# Patient Record
Sex: Female | Born: 1990 | Race: White | Hispanic: No | Marital: Single
Health system: Southern US, Community
[De-identification: ages and names within clinical notes are randomized; demographics above are authoritative.]

---

## 2015-06-19 ENCOUNTER — Ambulatory Visit
Admission: RE | Admit: 2015-06-19 | Discharge: 2015-06-19 | Disposition: A | Discharge status: Home / Self Care | Payer: Self-pay | Source: Ambulatory Visit | Attending: Family Medicine | Admitting: Family Medicine

## 2015-06-19 ENCOUNTER — Other Ambulatory Visit: Payer: Self-pay | Admitting: Family Medicine

## 2015-06-19 DIAGNOSIS — R52 Pain, unspecified: Secondary | ICD-10-CM

## 2016-06-29 IMAGING — CR DG ELBOW COMPLETE 3+V*L*
4 series · 4 of 4 positions shown · non-contrast
Comparison: None.

CLINICAL DATA: Fall on outstretched arm 1 month ago, clicking and
popping sensation, pain, initial encounter.

EXAM:
LEFT ELBOW - COMPLETE 3+ VIEW

[view not recorded (1 of 4)]
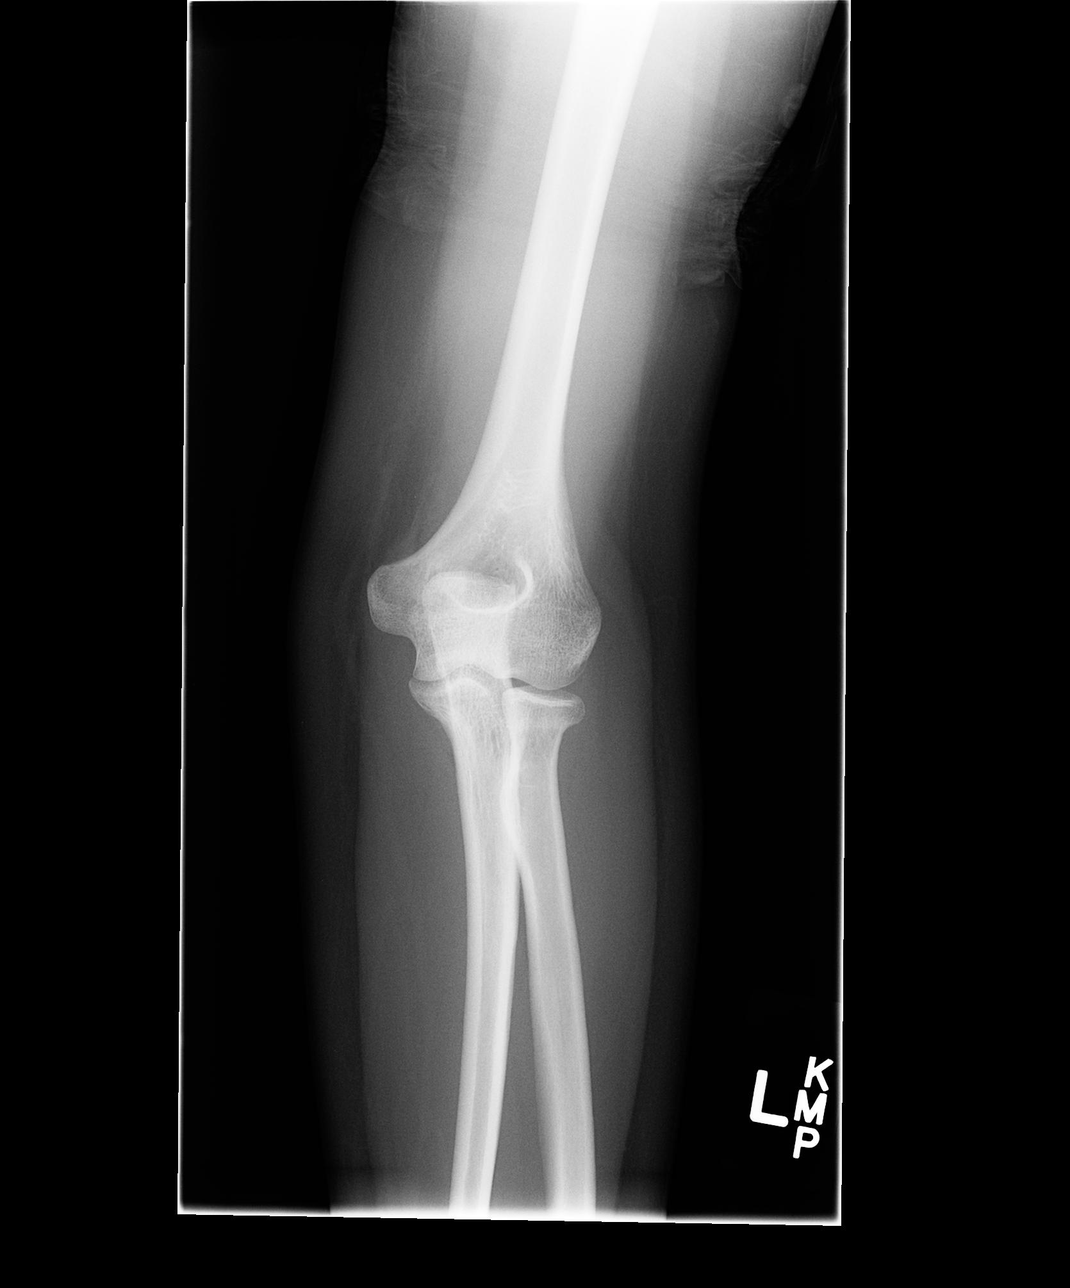

[view not recorded (2 of 4)]
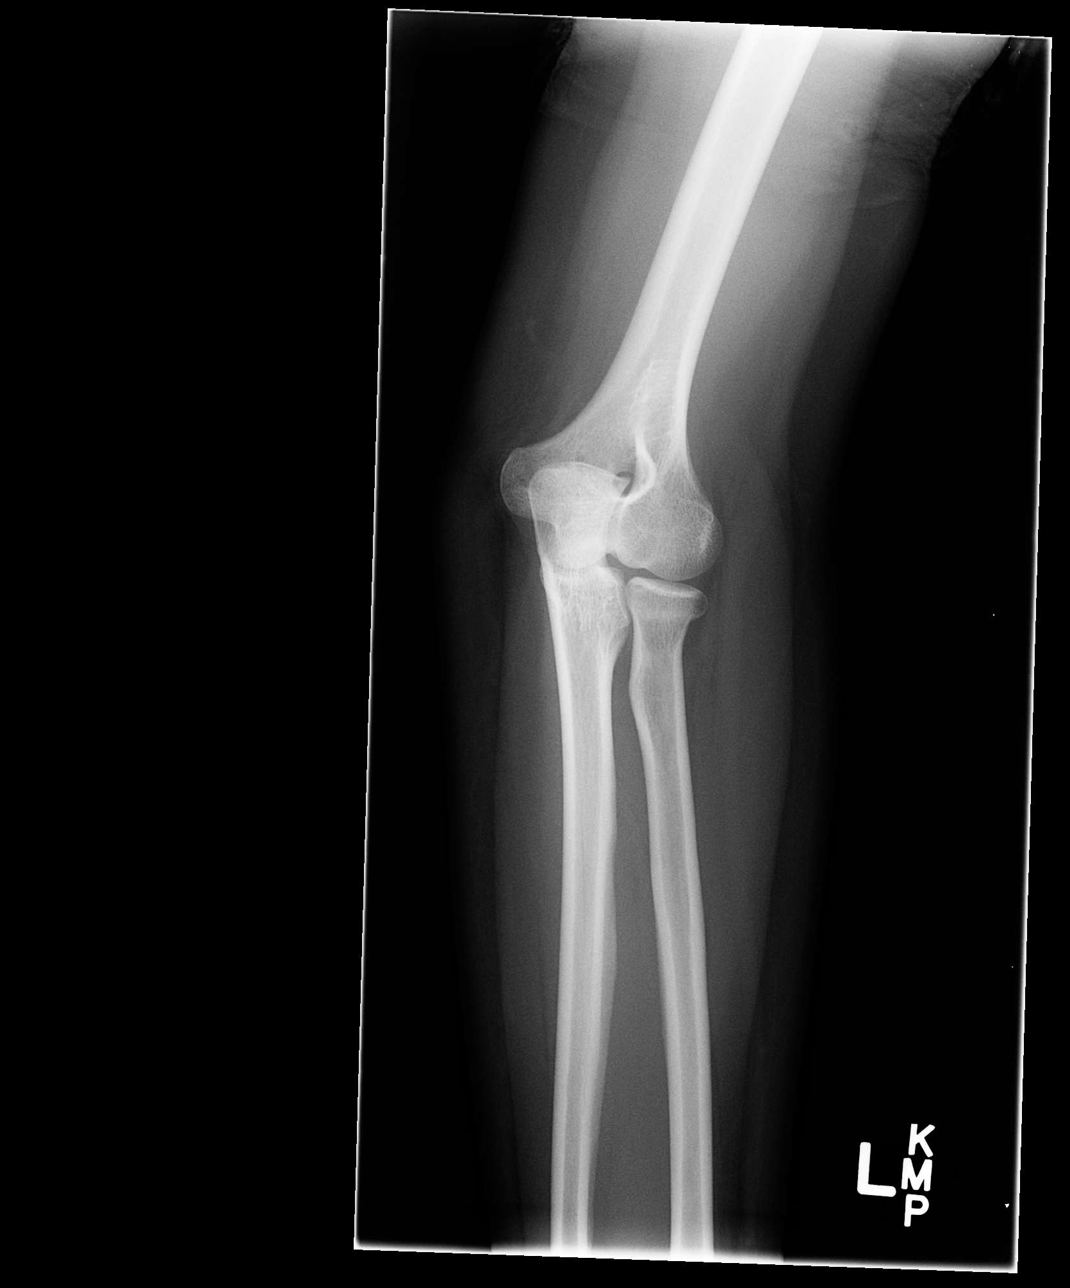

[view not recorded (3 of 4)]
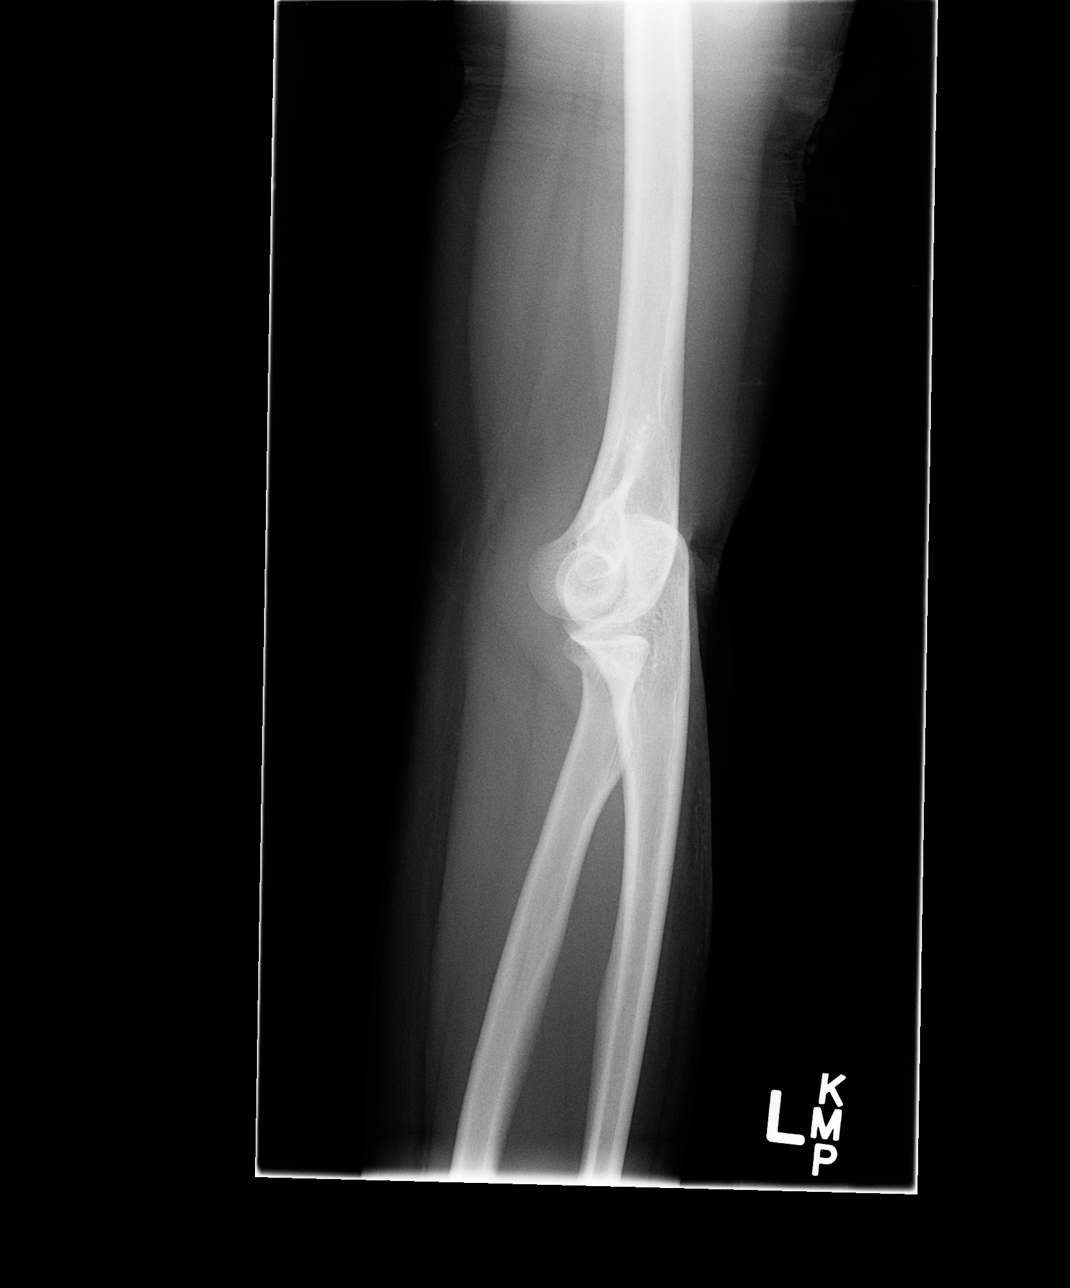

[view not recorded (4 of 4)]
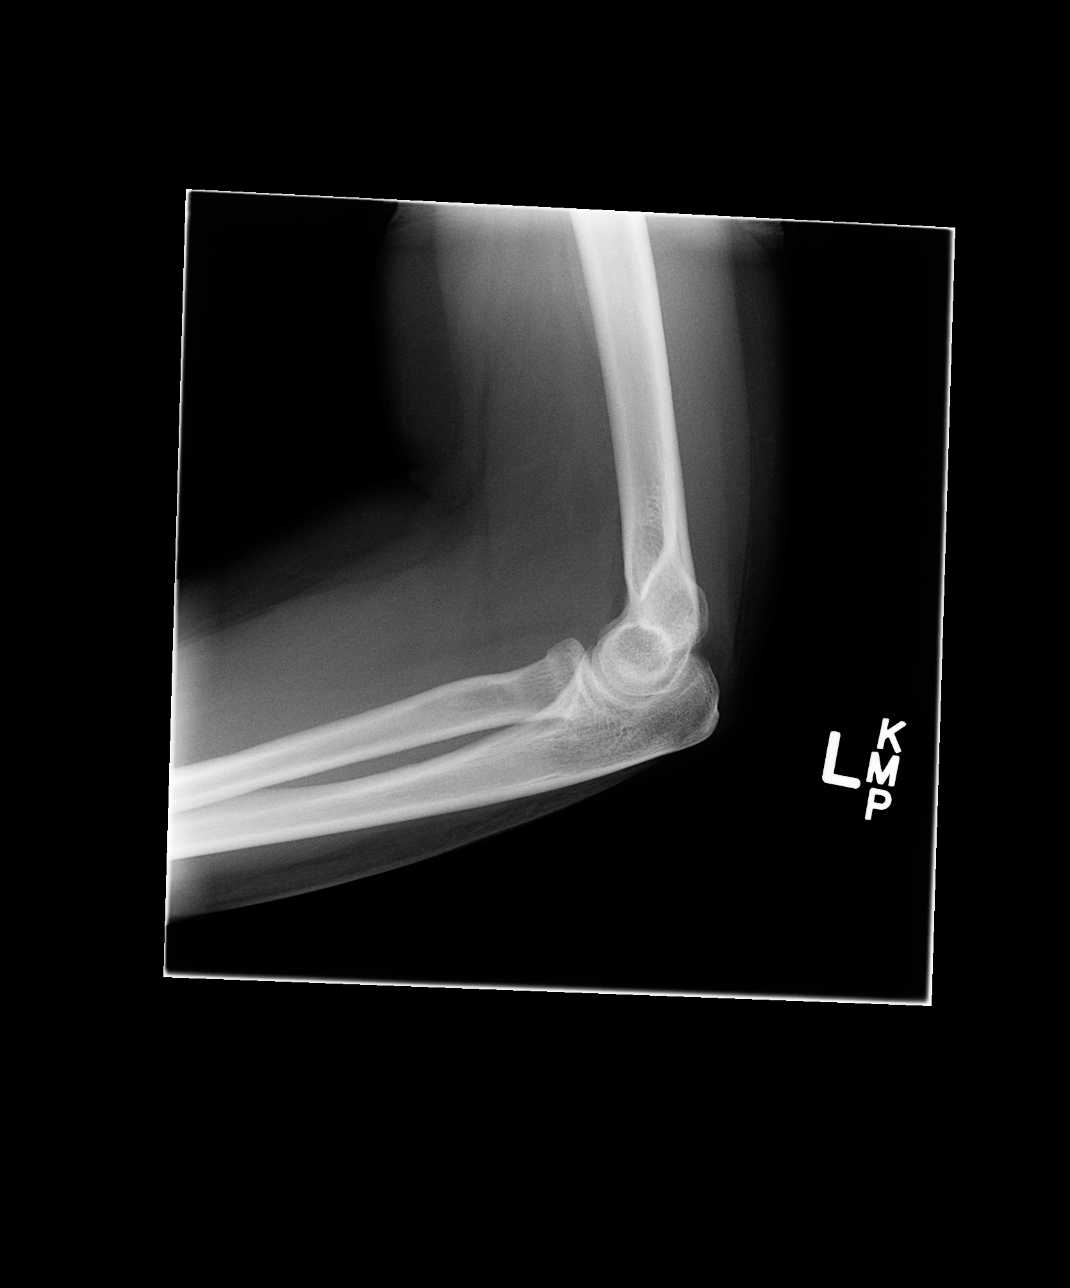

[4 of 4 positions shown; findings below may reference images not displayed]

FINDINGS: No acute osseous or joint abnormality.
IMPRESSION: No acute osseous or joint abnormality.

## 2017-09-17 ENCOUNTER — Telehealth: Payer: Self-pay | Admitting: General Practice

## 2017-09-17 NOTE — Telephone Encounter (Signed)
Called pt and LVM regarding her request to establish care with our practice. Advised that we have 2 female providers that are accepting new pts and she could call back to set up a new patient visit.

## 2017-10-04 ENCOUNTER — Ambulatory Visit: Payer: BLUE CROSS/BLUE SHIELD | Admitting: Family Medicine

## 2017-10-04 DIAGNOSIS — Z0289 Encounter for other administrative examinations: Secondary | ICD-10-CM
